# Patient Record
Sex: Male | Born: 2000 | Race: Black or African American | Hispanic: No | Marital: Single | Smoking: Never smoker
Health system: Southern US, Community
[De-identification: ages and names within clinical notes are randomized; demographics above are authoritative.]

## PROBLEM LIST (undated history)

## (undated) DIAGNOSIS — I517 Cardiomegaly: Secondary | ICD-10-CM

## (undated) DIAGNOSIS — F909 Attention-deficit hyperactivity disorder, unspecified type: Secondary | ICD-10-CM

## (undated) HISTORY — PX: INNER EAR SURGERY: SHX679

## (undated) HISTORY — PX: TYMPANOSTOMY TUBE PLACEMENT: SHX32

---
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
## 2021-10-01 IMAGING — MR MR KNEE*L* W/O CM
4 of 6 series · 20 of 40 positions shown · non-contrast
Comparison: 02/11/2020.  MRI left knee 06/07/2018.

CLINICAL DATA: Chronic anterior knee pain. Patellar tendinopathy.
Possible injury playing football.

EXAM:
MRI OF THE LEFT KNEE WITHOUT CONTRAST
TECHNIQUE: Multiplanar, multisequence MR imaging of the knee was performed. No
intravenous contrast was administered.

[Series 4: T2 fat-sat · axial · 4.0mm · 0.50mm/px · z∈[-63,+32]mm · 3 of 29 slices shown (1 of 2)]
[im 5/29]
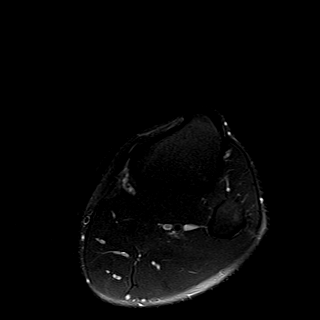
[im 15/29]
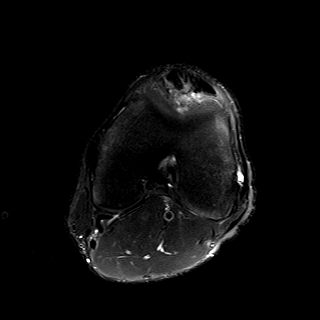
[im 24/29]
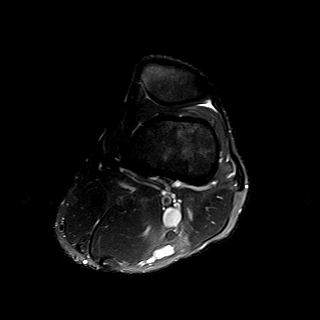

[Series 5: T2 fat-sat · coronal · 4.0mm · 0.29mm/px · 3 of 26 slices shown (2 of 2)]
[im 6/26]
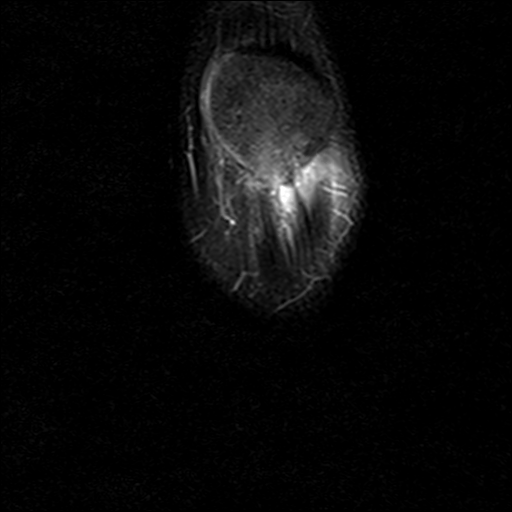
[im 16/26]
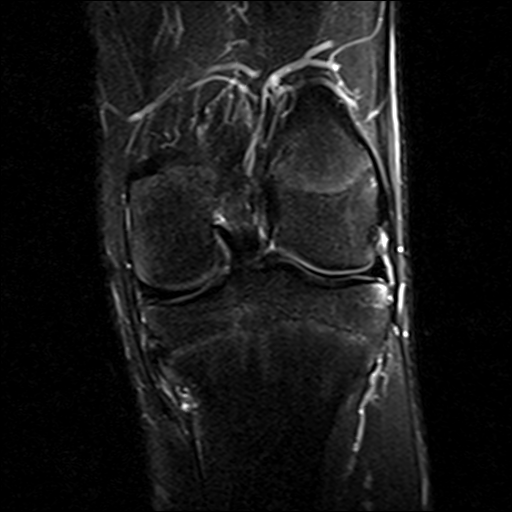
[im 26/26]
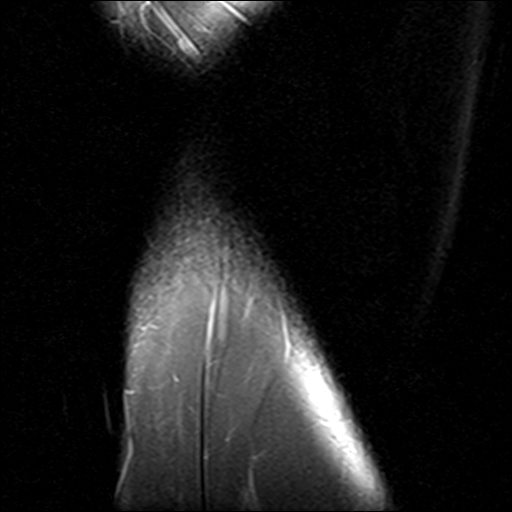

[Series 7: PD fat-sat · sagittal · 3.0mm · 0.29mm/px · 7 of 28 slices shown (1 of 2)]
[im 1/28]
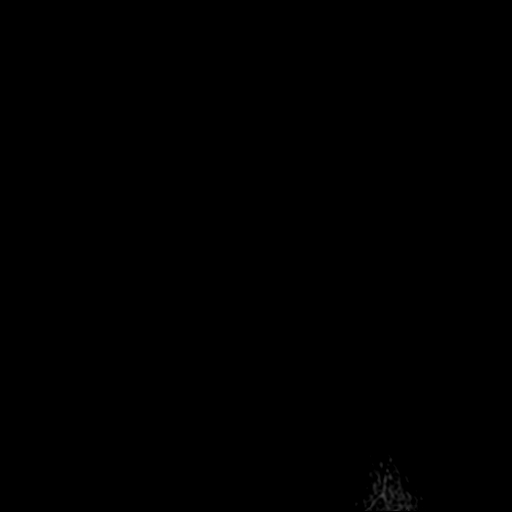
[im 5/28]
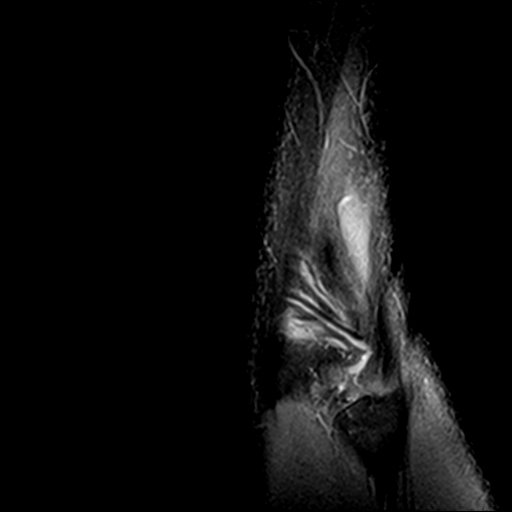
[im 10/28]
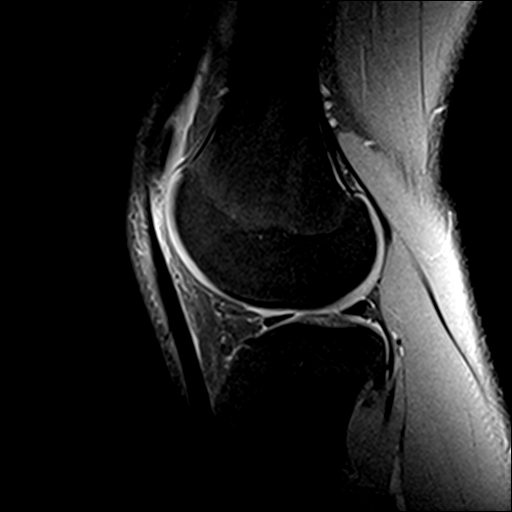
[im 14/28]
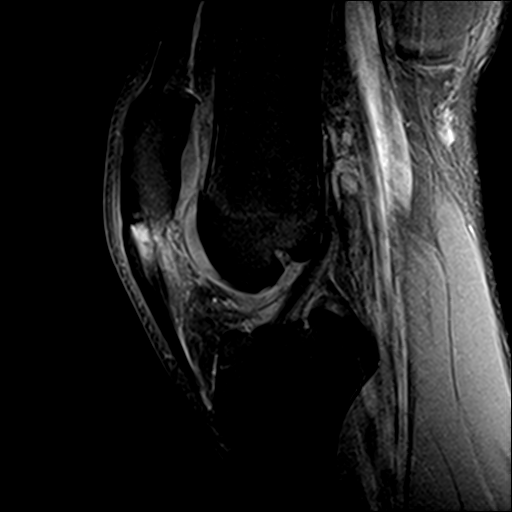
[im 19/28]
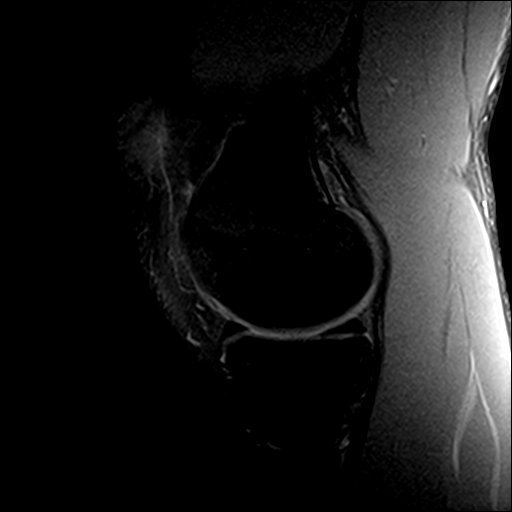
[im 23/28]
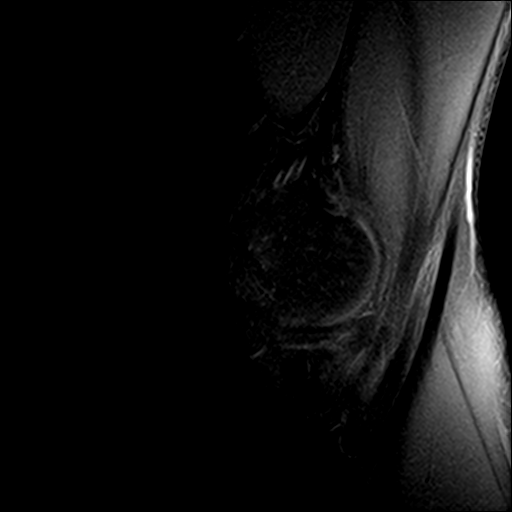
[im 28/28]
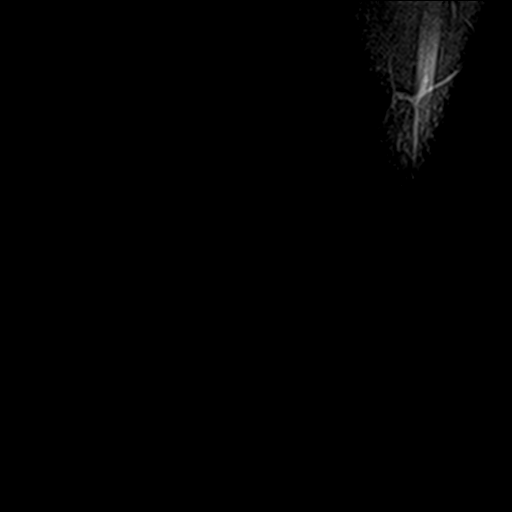

[Series 8: PD fat-sat · coronal · 3.0mm · 0.29mm/px · 7 of 32 slices shown (2 of 2)]
[im 1/32]
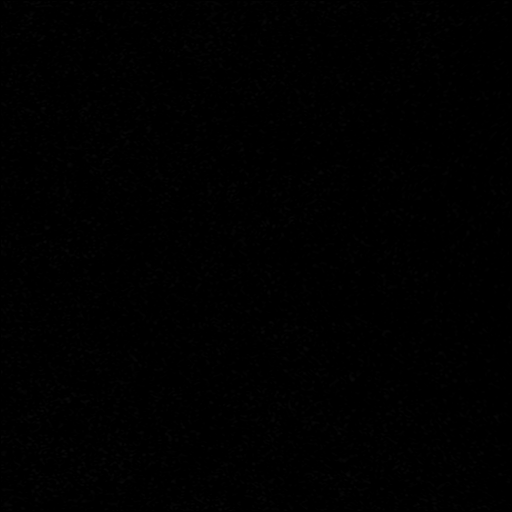
[im 5/32]
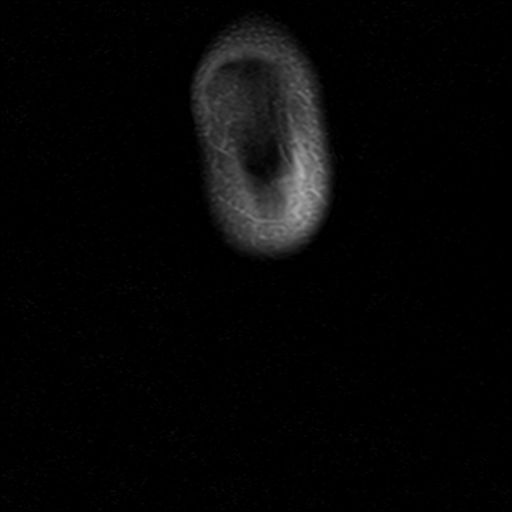
[im 9/32]
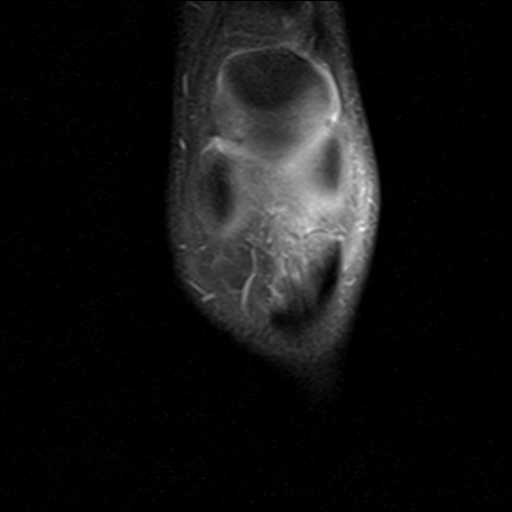
[im 14/32]
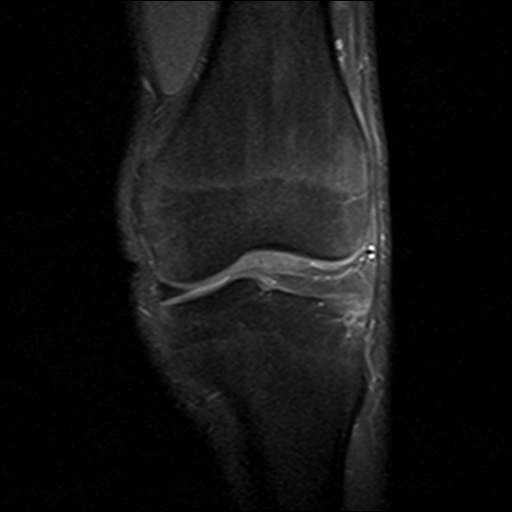
[im 18/32]
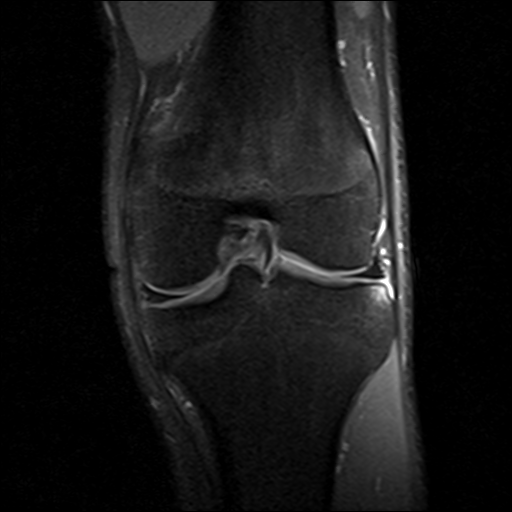
[im 23/32]
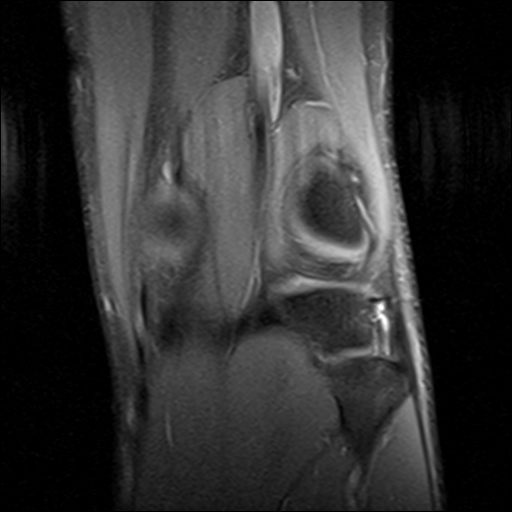
[im 27/32]
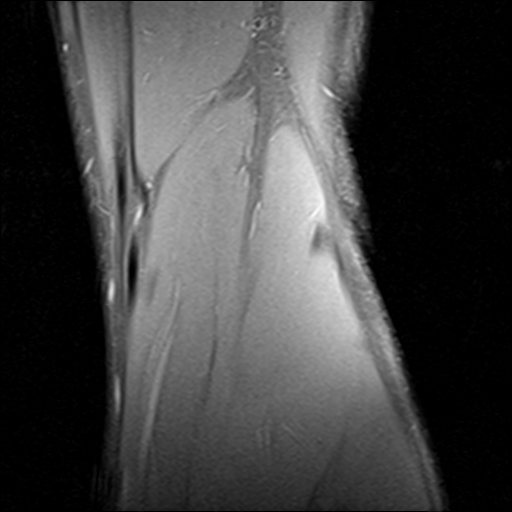

[20 of 40 positions shown; findings below may reference images not displayed]

FINDINGS: MENISCI

Medial meniscus:  Intact.

Lateral meniscus:  Intact.

LIGAMENTS

Cruciates:  Intact.

Collaterals:  Intact.

CARTILAGE

Patellofemoral:  Normal.

Medial:  Normal.

Lateral:  Normal.

Joint: No effusion. There is edema in Hoffa's fat off the inferior
pole of the patella eccentrically worse toward the lateral side.

Popliteal Fossa:  No Baker's cyst.

Extensor Mechanism: Again seen is tendinopathy of the superior
fibers of the patellar tendon. There is a tear of the undersurface
of the central fibers of the tendon measuring 0.8 cm transverse. The
tear involves approximately 80% of the thickness of the tendon AP.
The appearance is worse than on the prior MRI. Patella alta is
noted. Retinacular structures and the quadriceps tendon appear
normal.

Bones: Mild reactive marrow edema is seen in the inferior pole of
the patella. No fracture, contusion or stress change.

Other: None
IMPRESSION: Tendinosis of the superior fibers of the patellar tendon appears
worse than on the prior examination. There is progressive tearing of
the central fibers of the tendon involving approximately 80% of its
AP diameter and extending approximately 0.8 cm transverse.

Edema in Hoffa's fat centrally and toward the lateral side off the
inferior pole of the patella is consistent with reactive change from
tendinosis and suggestive of patellar tendon-lateral femoral condyle
friction syndrome.

Patella alta.

Intact menisci and ligaments.

## 2022-01-09 ENCOUNTER — Emergency Department (HOSPITAL_COMMUNITY)
Admission: EM | Admit: 2022-01-09 | Discharge: 2022-01-09 | Disposition: A | Discharge status: Home / Self Care | Payer: Medicaid Other | Attending: Emergency Medicine | Admitting: Emergency Medicine

## 2022-01-09 ENCOUNTER — Other Ambulatory Visit: Payer: Self-pay

## 2022-01-09 ENCOUNTER — Encounter (HOSPITAL_COMMUNITY): Payer: Self-pay

## 2022-01-09 DIAGNOSIS — H60502 Unspecified acute noninfective otitis externa, left ear: Secondary | ICD-10-CM | POA: Diagnosis not present

## 2022-01-09 DIAGNOSIS — H9202 Otalgia, left ear: Secondary | ICD-10-CM | POA: Diagnosis present

## 2022-01-09 MED ORDER — OFLOXACIN 0.3 % OT SOLN: 10.0000 [drp] | Freq: Every day | OTIC | 0 refills | Status: AC

## 2022-01-09 NOTE — ED Provider Notes (Signed)
[facility] DEPT Provider Note   CSN: [id number] Arrival date & time: 01/09/22  0120     History  Chief Complaint  Patient presents with   Ear Pain    Allen Marquez is a 21 y.o. male who presents with his mother and his girlfriend at the bedside with concern for Left ear pain and muffled hearing.  Has been swimming recently.  Denies any discharge from the ear, fevers, chills, or sore throat.  Denies any deep diving or recent flight.  Patient with history of tympanostomy tube placement bilaterally with multiple ear infections as a child left greater than right.  I personally reviewed his medical records.  He also has history of ADHD.  He is not on any medications daily.  HPI     Home Medications Prior to Admission medications   Medication Sig Start Date End Date Taking? Authorizing Provider  ofloxacin (FLOXIN) 0.3 % OTIC solution Place 10 drops into the left ear daily for 7 days. 01/09/22 01/16/22 Yes Thanos Cousineau, Eugene Garnet R, PA-C  diclofenac Sodium (VOLTAREN) 1 % GEL Apply 2 g topically 4 (four) times daily. 02/11/20   Aundra Dubin, PA-C  IBUPROFEN IB PO Take 10 mLs by mouth every 4 (four) hours as needed (pain and cough, fever).    [provider]  OVER THE COUNTER MEDICATION Take 10 mLs by mouth every 4 (four) hours as needed (for cough, cold symptoms). otc cold medication    [provider]      Allergies    Patient has no known allergies.    Review of Systems   Review of Systems  HENT:  Positive for ear pain.        Muffled hearing   Physical Exam Updated Vital Signs BP (!) 144/91 (BP Location: Right Arm)   Pulse 90   Temp 97.9 F (36.6 C) (Oral)   Resp 18   Ht 5\' 5"  (1.651 m)   Wt 81.6 kg   SpO2 98%   BMI 29.95 kg/m  Physical Exam Vitals and nursing note reviewed.  Constitutional:      Appearance: He is not ill-appearing or toxic-appearing.  HENT:     Head: Normocephalic and atraumatic.     Right Ear: Tympanic  membrane is scarred.     Left Ear: No hemotympanum. Tympanic membrane is injected, scarred, perforated and erythematous. Tympanic membrane is not retracted or bulging.     Ears:     Comments: Edematous, erythematous canal in the left ear.     Mouth/Throat:     Pharynx: Oropharynx is clear. Uvula midline.     Tonsils: No tonsillar exudate or tonsillar abscesses.  Eyes:     General: No scleral icterus.       Right eye: No discharge.        Left eye: No discharge.     Extraocular Movements: Extraocular movements intact.     Conjunctiva/sclera: Conjunctivae normal.     Pupils: Pupils are equal, round, and reactive to light.  Pulmonary:     Effort: Pulmonary effort is normal.  Skin:    General: Skin is warm and dry.  Neurological:     General: No focal deficit present.     Mental Status: He is alert.  Psychiatric:        Mood and Affect: Mood normal.    ED Results / Procedures / Treatments   Labs (all labs ordered are listed, but only abnormal results are displayed) Labs Reviewed - No  data to display  EKG None  Radiology No results found.  Procedures Procedures    Medications Ordered in ED Medications - No data to display  ED Course/ Medical Decision Making/ A&P                           Medical Decision Making 21 year old male presents with concern for left ear pain today.  Hypertensive on intake, vitals otherwise normal.  Left TM with scarring, injected TM and edematous erythematous canal without discharge.  TM is perforated though this does not appear to be acute.  Right TM with scarring but otherwise unremarkable otic exam.  Oropharyngeal exam is unremarkable.  Risk Prescription drug management.   We will flex drops for possible otitis externa in context of left TM perforation.  No further work-up warranted in the ER at this time.  Clinical concern for more emergent underlying etiology that would warrant further ED work-up patient management is exceedingly  low.  Allen Marquez and his family  voiced understanding of his medical evaluation and treatment plan. Each of their questions answered to their expressed satisfaction.  Return precautions were given.  Patient is well-appearing, stable, and was discharged in good condition.  This chart was dictated using voice recognition software, Dragon. Despite the best efforts of this provider to proofread and correct errors, errors may still occur which can change documentation meaning.    Final Clinical Impression(s) / ED Diagnoses Final diagnoses:  Acute otitis externa of left ear, unspecified type    Rx / DC Orders ED Discharge Orders          Ordered    ofloxacin (FLOXIN) 0.3 % OTIC solution  Daily        01/09/22 0222              Ashlay Altieri, Gypsy Balsam, PA-C 01/09/22 0227    Maudie Flakes, MD 01/09/22 [phone]

## 2022-01-09 NOTE — Discharge Instructions (Addendum)
You are seen here today for your left ear irritation and muffled hearing.  You have inflammation of the internal ear which may be secondary to infection.  You have been prescribed eardrops.  Please use these as prescribed for the entire course.  Return to the ER with any severe symptoms.

## 2022-01-09 NOTE — ED Triage Notes (Signed)
Pt was swimming yesterday and started having pain in his left ear afterwards.

## 2022-08-10 ENCOUNTER — Encounter (HOSPITAL_COMMUNITY): Payer: Self-pay

## 2022-08-10 ENCOUNTER — Ambulatory Visit (HOSPITAL_COMMUNITY)
Admission: EM | Admit: 2022-08-10 | Discharge: 2022-08-10 | Disposition: A | Discharge status: Home / Self Care | Payer: Medicaid Other | Attending: Physician Assistant | Admitting: Physician Assistant

## 2022-08-10 DIAGNOSIS — R300 Dysuria: Secondary | ICD-10-CM | POA: Diagnosis present

## 2022-08-10 DIAGNOSIS — Z113 Encounter for screening for infections with a predominantly sexual mode of transmission: Secondary | ICD-10-CM | POA: Insufficient documentation

## 2022-08-10 LAB — POCT URINALYSIS DIPSTICK, ED / UC
Bilirubin Urine: NEGATIVE
Glucose, UA: NEGATIVE mg/dL
Ketones, ur: NEGATIVE mg/dL
Nitrite: NEGATIVE
Protein, ur: 100 mg/dL — AB
Specific Gravity, Urine: 1.01 (ref 1.005–1.030)
Urobilinogen, UA: 0.2 mg/dL (ref 0.0–1.0)
pH: 7 (ref 5.0–8.0)

## 2022-08-10 LAB — HIV ANTIBODY (ROUTINE TESTING W REFLEX): HIV Screen 4th Generation wRfx: NONREACTIVE

## 2022-08-10 NOTE — Discharge Instructions (Signed)
Lab testing will be completed in 48 hours.  If you do not get a call from this office that indicates the lab test are negative.  Log onto MyChart to view the test results from the post in 48 hours.  Advised to follow-up with PCP or return to urgent care as needed.

## 2022-08-10 NOTE — ED Triage Notes (Signed)
Pt is here for STD testing. Denies any testing at this time.

## 2022-08-10 NOTE — ED Notes (Signed)
PT requested a urine sample

## 2022-08-10 NOTE — ED Provider Notes (Signed)
[facility]    CSN: [id number] Arrival date & time: 08/10/22  1642      History   Chief Complaint Chief Complaint  Patient presents with   SEXUALLY TRANSMITTED DISEASE    HPI Allen Marquez is a 22 y.o. male.   22 year old male presents with dysuria and for STI screening.  Patient indicates over the past couple days he has been having some dysuria.  Patient request STI testing to include HIV and RPR.  Patient denies any penile discharge.  He indicates that he does not have any sores or lesions on the shaft of the penis.  He indicates his last sexual encounter has been several weeks ago and it was unprotected.  Patient denies any fever or chills.     Past Medical History:  Diagnosis Date   ADHD (attention deficit hyperactivity disorder)    Enlarged heart     Patient Active Problem List   Diagnosis Date Noted   Patellar tenderness, left 05/26/2018    Past Surgical History:  Procedure Laterality Date   INNER EAR SURGERY     INNER EAR SURGERY     TYMPANOSTOMY TUBE PLACEMENT         Home Medications    Prior to Admission medications   Medication Sig Start Date End Date Taking? Authorizing Provider  diclofenac Sodium (VOLTAREN) 1 % GEL Apply 2 g topically 4 (four) times daily. 02/11/20   Aundra Dubin, PA-C  IBUPROFEN IB PO Take 10 mLs by mouth every 4 (four) hours as needed (pain and cough, fever).    [provider]  OVER THE COUNTER MEDICATION Take 10 mLs by mouth every 4 (four) hours as needed (for cough, cold symptoms). otc cold medication    [provider]    Family History History reviewed. No pertinent family history.  Social History Social History   Tobacco Use   Smoking status: Never   Smokeless tobacco: Never  Substance Use Topics   Alcohol use: No   Drug use: No     Allergies   Patient has no known allergies.   Review of Systems Review of Systems  Genitourinary:  Positive for dysuria.     Physical  Exam Triage Vital Signs ED Triage Vitals [08/10/22 1645]  Enc Vitals Group     BP (!) 151/85     Pulse Rate 100     Resp 16     Temp 98.4 F (36.9 C)     Temp Source Oral     SpO2 100 %     Weight      Height      Head Circumference      Peak Flow      Pain Score      Pain Loc      Pain Edu?      Excl. in [place]?    No data found.  Updated Vital Signs BP (!) 151/85 (BP Location: Left Arm)   Pulse 100   Temp 98.4 F (36.9 C) (Oral)   Resp 16   SpO2 100%   Visual Acuity Right Eye Distance:   Left Eye Distance:   Bilateral Distance:    Right Eye Near:   Left Eye Near:    Bilateral Near:     Physical Exam Constitutional:      Appearance: Normal appearance.  Neurological:     Mental Status: He is alert.      UC Treatments / Results  Labs (all labs ordered are  listed, but only abnormal results are displayed) Labs Reviewed  POCT URINALYSIS DIPSTICK, ED / UC - Abnormal; Notable for the following components:      Result Value   Hgb urine dipstick LARGE (*)    Protein, ur 100 (*)    Leukocytes,Ua SMALL (*)    All other components within normal limits  RPR  HIV ANTIBODY (ROUTINE TESTING W REFLEX)  CYTOLOGY, (ORAL, ANAL, URETHRAL) ANCILLARY ONLY    EKG   Radiology No results found.  Procedures Procedures (including critical care time)  Medications Ordered in UC Medications - No data to display  Initial Impression / Assessment and Plan / UC Course  I have reviewed the triage vital signs and the nursing notes.  Pertinent labs & imaging results that were available during my care of the patient were reviewed by me and considered in my medical decision making (see chart for details).    Plan: 1.  Screening for STI testing will be treated with the following: A.  Treatment may be considered depending on the results of the STI testing. 2.  The dysuria will be treated with the following: A.  Treatment may be considered depending on the results of the STI  testing. 3.  Patient advised follow-up PCP or return to urgent care as needed. Final Clinical Impressions(s) / UC Diagnoses   Final diagnoses:  Dysuria  Routine screening for STI (sexually transmitted infection)     Discharge Instructions      Lab testing will be completed in 48 hours.  If you do not get a call from this office that indicates the lab test are negative.  Log onto MyChart to view the test results from the post in 48 hours.  Advised to follow-up with PCP or return to urgent care as needed.    ED Prescriptions   None    PDMP not reviewed this encounter.   Ellsworth Lennox, PA-C 08/10/22 1759

## 2022-08-11 LAB — RPR: RPR Ser Ql: NONREACTIVE

## 2022-08-15 ENCOUNTER — Ambulatory Visit (HOSPITAL_COMMUNITY)
Admission: EM | Admit: 2022-08-15 | Discharge: 2022-08-15 | Disposition: A | Discharge status: Home / Self Care | Payer: Medicaid Other | Attending: Nurse Practitioner | Admitting: Nurse Practitioner

## 2022-08-15 ENCOUNTER — Encounter (HOSPITAL_COMMUNITY): Payer: Self-pay | Admitting: Emergency Medicine

## 2022-08-15 ENCOUNTER — Telehealth (HOSPITAL_COMMUNITY): Payer: Self-pay | Admitting: Emergency Medicine

## 2022-08-15 DIAGNOSIS — A549 Gonococcal infection, unspecified: Secondary | ICD-10-CM

## 2022-08-15 LAB — CYTOLOGY, (ORAL, ANAL, URETHRAL) ANCILLARY ONLY
Chlamydia: POSITIVE — AB
Comment: NEGATIVE
Comment: NEGATIVE
Comment: NORMAL
Neisseria Gonorrhea: POSITIVE — AB
Trichomonas: POSITIVE — AB

## 2022-08-15 MED ORDER — CEFTRIAXONE SODIUM 500 MG IJ SOLR
500.0000 mg | INTRAMUSCULAR | Status: DC
  Administered 2022-08-15: 500 mg via INTRAMUSCULAR

## 2022-08-15 MED ORDER — DOXYCYCLINE HYCLATE 100 MG PO CAPS: 100.0000 mg | ORAL_CAPSULE | Freq: Two times a day (BID) | ORAL | 0 refills | Status: AC

## 2022-08-15 MED ORDER — CEFTRIAXONE SODIUM 500 MG IJ SOLR
INTRAMUSCULAR | Status: AC
  Filled 2022-08-15: qty 500

## 2022-08-15 MED ORDER — LIDOCAINE HCL (PF) 1 % IJ SOLN
INTRAMUSCULAR | Status: AC
  Filled 2022-08-15: qty 2

## 2022-08-15 MED ORDER — METRONIDAZOLE 500 MG PO TABS: 2000.0000 mg | ORAL_TABLET | Freq: Once | ORAL | 0 refills | Status: AC

## 2022-08-15 NOTE — ED Triage Notes (Signed)
Pt reports that he was called today by our office and instructed to come in for STD treatment.

## 2022-11-07 ENCOUNTER — Ambulatory Visit (HOSPITAL_COMMUNITY)
Admission: EM | Admit: 2022-11-07 | Discharge: 2022-11-07 | Disposition: A | Discharge status: Home / Self Care | Payer: Medicaid Other | Attending: Internal Medicine | Admitting: Internal Medicine

## 2022-11-07 ENCOUNTER — Encounter (HOSPITAL_COMMUNITY): Payer: Self-pay

## 2022-11-07 DIAGNOSIS — Z1152 Encounter for screening for COVID-19: Secondary | ICD-10-CM | POA: Diagnosis not present

## 2022-11-07 DIAGNOSIS — J111 Influenza due to unidentified influenza virus with other respiratory manifestations: Secondary | ICD-10-CM | POA: Diagnosis not present

## 2022-11-07 LAB — POC INFLUENZA A AND B ANTIGEN (URGENT CARE ONLY)
INFLUENZA A ANTIGEN, POC: NEGATIVE
INFLUENZA B ANTIGEN, POC: NEGATIVE

## 2022-11-07 LAB — POCT RAPID STREP A, ED / UC: Streptococcus, Group A Screen (Direct): NEGATIVE

## 2022-11-07 MED ORDER — IBUPROFEN 600 MG PO TABS: 600.0000 mg | ORAL_TABLET | Freq: Four times a day (QID) | ORAL | 0 refills | Status: AC | PRN

## 2022-11-07 MED ORDER — ACETAMINOPHEN 325 MG PO TABS
650.0000 mg | ORAL_TABLET | Freq: Once | ORAL | Status: AC
  Administered 2022-11-07: 650 mg via ORAL

## 2022-11-07 MED ORDER — ACETAMINOPHEN 325 MG PO TABS
ORAL_TABLET | ORAL | Status: AC
  Filled 2022-11-07: qty 2

## 2022-11-07 NOTE — ED Triage Notes (Signed)
Pt states fever,body aches and sore throat x 2 days. Pt has not taken any medication at this time.

## 2022-11-07 NOTE — ED Provider Notes (Signed)
[facility]    CSN: [id number] Arrival date & time: 11/07/22  1123      History   Chief Complaint Chief Complaint  Patient presents with   Sore Throat   Fever   Generalized Body Aches    HPI Allen Marquez is a 22 y.o. male comes to the urgent care with 1 day history of sore throat, generalized body aches, fever and chills.  Symptoms started fairly abruptly yesterday and has been persistent.  Patient denies any nausea or vomiting.  No shortness of breath or wheezing.  No dizziness, near syncope or syncopal episodes.  No diarrhea.  No abdominal pain or distention.  No sick contacts.  Patient is fully vaccinated against COVID-19. HPI  Past Medical History:  Diagnosis Date   ADHD (attention deficit hyperactivity disorder)    Enlarged heart     Patient Active Problem List   Diagnosis Date Noted   Patellar tenderness, left 05/26/2018    Past Surgical History:  Procedure Laterality Date   INNER EAR SURGERY     INNER EAR SURGERY     TYMPANOSTOMY TUBE PLACEMENT         Home Medications    Prior to Admission medications   Medication Sig Start Date End Date Taking? Authorizing Provider  ibuprofen (ADVIL) 600 MG tablet Take 1 tablet (600 mg total) by mouth every 6 (six) hours as needed. 11/07/22  Yes Aisa Schoeppner, Myrene Galas, MD  diclofenac Sodium (VOLTAREN) 1 % GEL Apply 2 g topically 4 (four) times daily. 02/11/20   Aundra Dubin, PA-C  OVER THE COUNTER MEDICATION Take 10 mLs by mouth every 4 (four) hours as needed (for cough, cold symptoms). otc cold medication    [provider]    Family History No family history on file.  Social History Social History   Tobacco Use   Smoking status: Never   Smokeless tobacco: Never  Substance Use Topics   Alcohol use: No   Drug use: No     Allergies   Patient has no known allergies.   Review of Systems Review of Systems As per HPI  Physical Exam Triage Vital Signs ED Triage Vitals [11/07/22 1200]   Enc Vitals Group     BP (!) 123/93     Pulse Rate (!) 123     Resp 16     Temp (!) 101 F (38.3 C)     Temp Source Oral     SpO2      Weight      Height      Head Circumference      Peak Flow      Pain Score      Pain Loc      Pain Edu?      Excl. in [place]?    No data found.  Updated Vital Signs BP (!) 123/93 (BP Location: Right Arm)   Pulse (!) 123   Temp (!) 101 F (38.3 C) (Oral)   Resp 16   Visual Acuity Right Eye Distance:   Left Eye Distance:   Bilateral Distance:    Right Eye Near:   Left Eye Near:    Bilateral Near:     Physical Exam Vitals and nursing note reviewed.  Constitutional:      Appearance: He is ill-appearing.  HENT:     Right Ear: Tympanic membrane normal.     Left Ear: Tympanic membrane normal.     Mouth/Throat:     Mouth: Mucous membranes  are moist.     Pharynx: Posterior oropharyngeal erythema present.     Tonsils: No tonsillar exudate. 2+ on the right. 2+ on the left.  Cardiovascular:     Rate and Rhythm: Normal rate and regular rhythm.  Pulmonary:     Effort: Pulmonary effort is normal.     Breath sounds: Normal breath sounds.  Neurological:     Mental Status: He is alert.      UC Treatments / Results  Labs (all labs ordered are listed, but only abnormal results are displayed) Labs Reviewed  SARS CORONAVIRUS 2 (TAT 6-24 HRS)  CULTURE, GROUP A STREP ([facility])  POC INFLUENZA A AND B ANTIGEN (URGENT CARE ONLY)  POCT RAPID STREP A, ED / UC    EKG   Radiology No results found.  Procedures Procedures (including critical care time)  Medications Ordered in UC Medications  acetaminophen (TYLENOL) tablet 650 mg (650 mg Oral Given 11/07/22 1204)    Initial Impression / Assessment and Plan / UC Course  I have reviewed the triage vital signs and the nursing notes.  Pertinent labs & imaging results that were available during my care of the patient were reviewed by me and considered in my medical decision making (see chart for  details).     1.  Influenza-like illness: Point-of-care flu test is negative Strep test is negative Hydration recommended Tylenol/Motrin as needed for pain and/or fever Reassurance given Return precautions given. Final Clinical Impressions(s) / UC Diagnoses   Final diagnoses:  Influenza-like illness     Discharge Instructions      Please maintain adequate hydration Please take Tylenol or ibuprofen as needed for pain and/or fever Your flu test is negative Strep test is negative Throat cultures will be sent Will call you with recommendations if labs are abnormal. Your symptoms are likely secondary to a viral illness and supportive care is recommended. We have ordered a COVID-19 test Return to urgent care if you have worsening symptoms.     ED Prescriptions     Medication Sig Dispense Auth. Provider   ibuprofen (ADVIL) 600 MG tablet Take 1 tablet (600 mg total) by mouth every 6 (six) hours as needed. 30 tablet Mikeya Tomasetti, Myrene Galas, MD      PDMP not reviewed this encounter.   Chase Picket, MD 11/07/22 [phone]

## 2022-11-07 NOTE — Discharge Instructions (Addendum)
Please maintain adequate hydration Please take Tylenol or ibuprofen as needed for pain and/or fever Your flu test is negative Strep test is negative Throat cultures will be sent Will call you with recommendations if labs are abnormal. Your symptoms are likely secondary to a viral illness and supportive care is recommended. We have ordered a COVID-19 test Return to urgent care if you have worsening symptoms.

## 2022-11-07 NOTE — ED Notes (Signed)
Flu swab obtained, labeled at bedside and placed in lab ?

## 2022-11-08 LAB — SARS CORONAVIRUS 2 (TAT 6-24 HRS): SARS Coronavirus 2: NEGATIVE

## 2022-11-08 LAB — CULTURE, GROUP A STREP (THRC)

## 2022-11-09 LAB — CULTURE, GROUP A STREP (THRC)
